# Patient Record
Sex: Female | Born: 1990 | Race: Black or African American | Hispanic: No | Marital: Single
Health system: Southern US, Community
[De-identification: ages and names within clinical notes are randomized; demographics above are authoritative.]

---

## 2016-04-05 ENCOUNTER — Emergency Department (HOSPITAL_COMMUNITY)
Admission: EM | Admit: 2016-04-05 | Discharge: 2016-04-06 | Disposition: A | Discharge status: Home / Self Care | Payer: No Typology Code available for payment source | Attending: Emergency Medicine | Admitting: Emergency Medicine

## 2016-04-05 DIAGNOSIS — R918 Other nonspecific abnormal finding of lung field: Secondary | ICD-10-CM | POA: Insufficient documentation

## 2016-04-05 DIAGNOSIS — Y939 Activity, unspecified: Secondary | ICD-10-CM | POA: Insufficient documentation

## 2016-04-05 DIAGNOSIS — Y92410 Unspecified street and highway as the place of occurrence of the external cause: Secondary | ICD-10-CM | POA: Diagnosis not present

## 2016-04-05 DIAGNOSIS — R935 Abnormal findings on diagnostic imaging of other abdominal regions, including retroperitoneum: Secondary | ICD-10-CM | POA: Diagnosis not present

## 2016-04-05 DIAGNOSIS — Z79899 Other long term (current) drug therapy: Secondary | ICD-10-CM | POA: Diagnosis not present

## 2016-04-05 DIAGNOSIS — S30810A Abrasion of lower back and pelvis, initial encounter: Secondary | ICD-10-CM | POA: Insufficient documentation

## 2016-04-05 DIAGNOSIS — S0502XA Injury of conjunctiva and corneal abrasion without foreign body, left eye, initial encounter: Secondary | ICD-10-CM | POA: Insufficient documentation

## 2016-04-05 DIAGNOSIS — Z23 Encounter for immunization: Secondary | ICD-10-CM | POA: Insufficient documentation

## 2016-04-05 DIAGNOSIS — Y999 Unspecified external cause status: Secondary | ICD-10-CM | POA: Insufficient documentation

## 2016-04-05 DIAGNOSIS — S0990XA Unspecified injury of head, initial encounter: Secondary | ICD-10-CM | POA: Diagnosis present

## 2016-04-05 MED ORDER — TETRACAINE HCL 0.5 % OP SOLN
2.0000 [drp] | Freq: Once | OPHTHALMIC | Status: AC
  Administered 2016-04-06: 2 [drp] via OPHTHALMIC
  Filled 2016-04-05: qty 2

## 2016-04-05 MED ORDER — TETANUS-DIPHTH-ACELL PERTUSSIS 5-2.5-18.5 LF-MCG/0.5 IM SUSP
0.5000 mL | Freq: Once | INTRAMUSCULAR | Status: AC
  Administered 2016-04-06: 0.5 mL via INTRAMUSCULAR
  Filled 2016-04-05 (×2): qty 0.5

## 2016-04-05 MED ORDER — FLUORESCEIN SODIUM 1 MG OP STRP
1.0000 | ORAL_STRIP | Freq: Once | OPHTHALMIC | Status: AC
  Administered 2016-04-06: 1 via OPHTHALMIC
  Filled 2016-04-05 (×2): qty 1

## 2016-04-05 MED ORDER — SODIUM CHLORIDE 0.9 % IV BOLUS (SEPSIS)
1000.0000 mL | Freq: Once | INTRAVENOUS | Status: AC
  Administered 2016-04-06: 1000 mL via INTRAVENOUS
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
## 2016-04-06 ENCOUNTER — Emergency Department (HOSPITAL_COMMUNITY): Payer: No Typology Code available for payment source

## 2016-04-06 ENCOUNTER — Encounter (HOSPITAL_COMMUNITY): Payer: Self-pay | Admitting: *Deleted

## 2016-04-06 LAB — COMPREHENSIVE METABOLIC PANEL
ALT: 16 U/L (ref 14–54)
ANION GAP: 11 (ref 5–15)
AST: 31 U/L (ref 15–41)
Albumin: 4 g/dL (ref 3.5–5.0)
Alkaline Phosphatase: 73 U/L (ref 38–126)
BUN: 11 mg/dL (ref 6–20)
CHLORIDE: 107 mmol/L (ref 101–111)
CO2: 20 mmol/L — ABNORMAL LOW (ref 22–32)
Calcium: 9.2 mg/dL (ref 8.9–10.3)
Creatinine, Ser: 1.16 mg/dL — ABNORMAL HIGH (ref 0.44–1.00)
Glucose, Bld: 141 mg/dL — ABNORMAL HIGH (ref 65–99)
POTASSIUM: 3.2 mmol/L — AB (ref 3.5–5.1)
Sodium: 138 mmol/L (ref 135–145)
Total Bilirubin: 0.6 mg/dL (ref 0.3–1.2)
Total Protein: 7.1 g/dL (ref 6.5–8.1)

## 2016-04-06 LAB — CBC WITH DIFFERENTIAL/PLATELET
BASOS ABS: 0 10*3/uL (ref 0.0–0.1)
BASOS PCT: 0 %
Basophils Absolute: 0 10*3/uL (ref 0.0–0.1)
Basophils Relative: 0 %
EOS ABS: 0 10*3/uL (ref 0.0–0.7)
EOS PCT: 0 %
EOS PCT: 0 %
Eosinophils Absolute: 0 10*3/uL (ref 0.0–0.7)
HCT: 41.2 % (ref 36.0–46.0)
HCT: 39.6 % (ref 36.0–46.0)
Hemoglobin: 13.8 g/dL (ref 12.0–15.0)
Hemoglobin: 13.2 g/dL (ref 12.0–15.0)
LYMPHS ABS: 1.4 10*3/uL (ref 0.7–4.0)
LYMPHS PCT: 6 %
Lymphocytes Relative: 5 %
Lymphs Abs: 1.3 10*3/uL (ref 0.7–4.0)
MCH: 28.6 pg (ref 26.0–34.0)
MCH: 28.8 pg (ref 26.0–34.0)
MCHC: 33.5 g/dL (ref 30.0–36.0)
MCHC: 33.3 g/dL (ref 30.0–36.0)
MCV: 85.9 fL (ref 78.0–100.0)
MCV: 86 fL (ref 78.0–100.0)
MONOS PCT: 8 %
Monocytes Absolute: 2.2 10*3/uL — ABNORMAL HIGH (ref 0.1–1.0)
Monocytes Absolute: 1.5 10*3/uL — ABNORMAL HIGH (ref 0.1–1.0)
Monocytes Relative: 7 %
NEUTROS ABS: 24.4 10*3/uL — AB (ref 1.7–7.7)
Neutro Abs: 18.1 10*3/uL — ABNORMAL HIGH (ref 1.7–7.7)
Neutrophils Relative %: 87 %
Neutrophils Relative %: 87 %
PLATELETS: 288 10*3/uL (ref 150–400)
PLATELETS: 300 10*3/uL (ref 150–400)
RBC: 4.79 MIL/uL (ref 3.87–5.11)
RBC: 4.61 MIL/uL (ref 3.87–5.11)
RDW: 14 % (ref 11.5–15.5)
RDW: 14.2 % (ref 11.5–15.5)
WBC: 28 10*3/uL — ABNORMAL HIGH (ref 4.0–10.5)
WBC: 20.9 10*3/uL — AB (ref 4.0–10.5)

## 2016-04-06 LAB — RAPID URINE DRUG SCREEN, HOSP PERFORMED
AMPHETAMINES: NOT DETECTED
BENZODIAZEPINES: NOT DETECTED
Barbiturates: NOT DETECTED
Cocaine: NOT DETECTED
OPIATES: NOT DETECTED
Tetrahydrocannabinol: NOT DETECTED

## 2016-04-06 LAB — I-STAT CHEM 8, ED
BUN: 9 mg/dL (ref 6–20)
CALCIUM ION: 1.17 mmol/L (ref 1.15–1.40)
CREATININE: 1.1 mg/dL — AB (ref 0.44–1.00)
Chloride: 108 mmol/L (ref 101–111)
Glucose, Bld: 135 mg/dL — ABNORMAL HIGH (ref 65–99)
HCT: 44 % (ref 36.0–46.0)
HEMOGLOBIN: 15 g/dL (ref 12.0–15.0)
Potassium: 3.4 mmol/L — ABNORMAL LOW (ref 3.5–5.1)
SODIUM: 143 mmol/L (ref 135–145)
TCO2: 20 mmol/L (ref 0–100)

## 2016-04-06 LAB — I-STAT BETA HCG BLOOD, ED (MC, WL, AP ONLY): I-stat hCG, quantitative: <5 m[IU]/mL (ref <5)

## 2016-04-06 LAB — URINALYSIS, ROUTINE W REFLEX MICROSCOPIC
BILIRUBIN URINE: NEGATIVE
Glucose, UA: NEGATIVE mg/dL
Ketones, ur: NEGATIVE mg/dL
Leukocytes, UA: NEGATIVE
NITRITE: NEGATIVE
PH: 6 (ref 5.0–8.0)
Protein, ur: 30 mg/dL — AB
SPECIFIC GRAVITY, URINE: 1.01 (ref 1.005–1.030)

## 2016-04-06 LAB — URINE MICROSCOPIC-ADD ON

## 2016-04-06 LAB — ETHANOL: ALCOHOL ETHYL (B): 32 mg/dL — AB (ref <5)

## 2016-04-06 MED ORDER — POLYMYXIN B-TRIMETHOPRIM 10000-0.1 UNIT/ML-% OP SOLN: 1.0000 [drp] | OPHTHALMIC | 0 refills | Status: AC

## 2016-04-06 MED ORDER — IOPAMIDOL (ISOVUE-300) INJECTION 61%
INTRAVENOUS | Status: AC
  Administered 2016-04-06: 100 mL
  Filled 2016-04-06: qty 100

## 2016-04-06 NOTE — ED Notes (Signed)
Patient transported to CT 

## 2016-04-06 NOTE — Discharge Instructions (Signed)
Your testing is reassuring. Follow up with your doctor. Take the antibiotics as prescribed. Return to the ED if you develop worsening headache, vomiting, behavior change or any other concerns.

## 2016-04-06 NOTE — ED Notes (Signed)
Nurse drawing labs. 

## 2016-04-06 NOTE — ED Notes (Signed)
Pt verbalized understanding of d/c instructions and has no further questions. Pt is stable, A&Ox4, VSS.  

## 2016-04-06 NOTE — ED Notes (Signed)
Pt able to ambulate w/o dizziness. Pt ambulated to bathroom.
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
